# Patient Record
Sex: Male | Born: 1970 | Race: White | Hispanic: No | Marital: Married | Smoking: Never smoker
Health system: Southern US, Community
[De-identification: ages and names within clinical notes are randomized; demographics above are authoritative.]

## PROBLEM LIST (undated history)

## (undated) DIAGNOSIS — Z9889 Other specified postprocedural states: Secondary | ICD-10-CM

## (undated) DIAGNOSIS — E785 Hyperlipidemia, unspecified: Secondary | ICD-10-CM

## (undated) DIAGNOSIS — F329 Major depressive disorder, single episode, unspecified: Secondary | ICD-10-CM

## (undated) DIAGNOSIS — R05 Cough: Secondary | ICD-10-CM

## (undated) DIAGNOSIS — B001 Herpesviral vesicular dermatitis: Secondary | ICD-10-CM

## (undated) DIAGNOSIS — F429 Obsessive-compulsive disorder, unspecified: Secondary | ICD-10-CM

## (undated) DIAGNOSIS — R112 Nausea with vomiting, unspecified: Secondary | ICD-10-CM

## (undated) DIAGNOSIS — F419 Anxiety disorder, unspecified: Secondary | ICD-10-CM

## (undated) DIAGNOSIS — S83249A Other tear of medial meniscus, current injury, unspecified knee, initial encounter: Secondary | ICD-10-CM

## (undated) DIAGNOSIS — J209 Acute bronchitis, unspecified: Secondary | ICD-10-CM

## (undated) DIAGNOSIS — K219 Gastro-esophageal reflux disease without esophagitis: Secondary | ICD-10-CM

## (undated) DIAGNOSIS — I10 Essential (primary) hypertension: Secondary | ICD-10-CM

## (undated) DIAGNOSIS — F32A Depression, unspecified: Secondary | ICD-10-CM

## (undated) DIAGNOSIS — R21 Rash and other nonspecific skin eruption: Secondary | ICD-10-CM

## (undated) DIAGNOSIS — G4730 Sleep apnea, unspecified: Secondary | ICD-10-CM

## (undated) DIAGNOSIS — R0981 Nasal congestion: Secondary | ICD-10-CM

## (undated) HISTORY — PX: ANTERIOR CRUCIATE LIGAMENT REPAIR: SHX115

## (undated) HISTORY — PX: ADENOIDECTOMY W/ MYRINGOTOMY: SHX1128

## (undated) HISTORY — PX: KNEE ARTHROSCOPY: SUR90

## (undated) HISTORY — PX: MOUTH SURGERY: SHX715

## (undated) HISTORY — PX: COSMETIC SURGERY: SHX468

---

## 1999-05-25 ENCOUNTER — Ambulatory Visit (HOSPITAL_COMMUNITY): Admission: RE | Admit: 1999-05-25 | Discharge: 1999-05-25 | Payer: Self-pay | Admitting: Family Medicine

## 1999-08-10 ENCOUNTER — Ambulatory Visit: Admission: RE | Admit: 1999-08-10 | Discharge: 1999-08-10 | Payer: Self-pay | Admitting: Family Medicine

## 2003-02-11 ENCOUNTER — Ambulatory Visit (HOSPITAL_BASED_OUTPATIENT_CLINIC_OR_DEPARTMENT_OTHER): Admission: RE | Admit: 2003-02-11 | Discharge: 2003-02-11 | Payer: Self-pay | Admitting: Family Medicine

## 2003-06-09 ENCOUNTER — Ambulatory Visit (HOSPITAL_BASED_OUTPATIENT_CLINIC_OR_DEPARTMENT_OTHER): Admission: RE | Admit: 2003-06-09 | Discharge: 2003-06-09 | Payer: Self-pay | Admitting: Family Medicine

## 2003-12-24 ENCOUNTER — Inpatient Hospital Stay (HOSPITAL_COMMUNITY): Admission: EM | Admit: 2003-12-24 | Discharge: 2003-12-25 | Payer: Self-pay | Admitting: Emergency Medicine

## 2008-01-31 ENCOUNTER — Encounter (INDEPENDENT_AMBULATORY_CARE_PROVIDER_SITE_OTHER): Payer: Self-pay | Admitting: Family Medicine

## 2008-01-31 ENCOUNTER — Ambulatory Visit: Admission: RE | Admit: 2008-01-31 | Discharge: 2008-01-31 | Payer: Self-pay | Admitting: Family Medicine

## 2008-01-31 ENCOUNTER — Ambulatory Visit: Payer: Self-pay | Admitting: Vascular Surgery

## 2008-04-04 ENCOUNTER — Ambulatory Visit (HOSPITAL_COMMUNITY): Admission: RE | Admit: 2008-04-04 | Discharge: 2008-04-04 | Payer: Self-pay | Admitting: Orthopedic Surgery

## 2010-11-18 NOTE — Discharge Summary (Signed)
NAME:  AARONJAMES, KELSAY                        ACCOUNT NO.:  [id number]   MEDICAL RECORD NO.:  [id number]                   PATIENT TYPE:  INP   LOCATION:  0467                                 FACILITY:  [facility]   PHYSICIAN:  Sherin Quarry, MD                   DATE OF BIRTH:  10-29-1970   DATE OF ADMISSION:  12/24/2003  DATE OF DISCHARGE:  12/25/2003                                 DISCHARGE SUMMARY   Reva Bores. Fussell is a very pleasant gentleman, who indicates that on Monday  prior to admission, a friend of his suggested that they take up weight  lifting.  His friend apparently has done a lot of weight lifting over the  years and encouraged him to lift quite heavy weights over a period of 1  hour.  They did this in an enclosed space that was pretty hot.  He indicates  that he felt he was very exhausted as a result of this but did not want to  stop in light of his friend's encouragement.  That night he felt extremely  sore.  On Tuesday, he continued to have generalized muscle aching but once  again did extensive weight lifting with his friend.  On June 23, he  presented to Dr. Tiburcio Pea' office complaining of fatigue and generalized  myalgias as well as loss of appetite.  Dr. Tiburcio Pea did a series of blood  tests including a CPK which was apparently 6817.  A urinalysis was normal.  In light of these findings, he was sent to the emergency room where he was  seen by Dr. Julio Sicks.   PHYSICAL EXAMINATION:  VITAL SIGNS:  Dr. Julio Sicks reported that on his  physical exam, his blood pressure was 150/96.  His heart rate was 90,  respirations 18, temperature 98.2.  HEENT:  Within normal limits.  CHEST:  Clear.  CARDIOVASCULAR:  Normal S1 and S2 without rubs, murmurs, or gallops.  ABDOMEN:  Benign.  There were normal bowel sounds without masses or  tenderness.  There was no guarding or rebound.  NEUROLOGIC TESTING/EXAMINATION OF EXTREMITIES:  Normal.   Laboratory studies obtained included a  CBC which revealed a hemoglobin of  16.5.  White count was 4000.  Potassium was 3.9, creatinine 0.9.  Glucose  was 120.  The urinalysis was absolutely normal.   The patient was felt to have evidence of mild rhabdomyolysis, and it was  felt prudent to admit him to the hospital.  He was started on IV of normal  saline at 200 mL/hr.  During the course of his hospitalization, his blood  pressure was carefully monitored.  On the following evening, his blood  pressure was 160/101 and 157/81 on two determinations.  The next day, the  blood pressure was 141/77 and 140/80 on two determinations.  CPK level was  monitored.  On June 24, a.m., it was 6614.  On June 24, p.m., it  was 5900.  Renal function was repeated.  The creatinine was 0.8.  Phosphorous was 3.3.  The patient indicated that he felt better with IV hydrated.  On June 24, it  was felt reasonable to discharge him.   DISCHARGE DIAGNOSES:  1. Mild rhabdomyolysis, probably secondary to excessive weight lifting.  2. Concern about hypertension.  On discharge, the patient was advised to     refrain from weight lifting until further notice.  He was encouraged,     however, to engage in reasonable aerobic activity.  He was also advised     to avoid excessive exercise in a hot environment.  He was advised to     drink plenty of fluids and try to keep up a good urine output with clear     urine.  He was advised that he could return to work on Monday.  I     encouraged him to return to see Dr. Tiburcio Pea on Thursday.  The purpose of     this was two-fold.  First, just to follow up on his status     and make sure he was doing alright and secondly because of concern about     his blood pressure, I thought it would be a good idea to repeat this     value.   CONDITION AT THE TIME OF DISCHARGE:  Good.                                               Sherin Quarry, MD    SY/MEDQ  D:  12/25/2003  T:  12/26/2003  Job:  [id number]   cc:   Holley Bouche,  M.D.  [address]  [place], [place] [address]  Fax: [phone]

## 2011-01-11 ENCOUNTER — Emergency Department (HOSPITAL_COMMUNITY)
Admission: EM | Admit: 2011-01-11 | Discharge: 2011-01-11 | Disposition: A | Discharge status: Home / Self Care | Payer: BC Managed Care – PPO | Attending: Emergency Medicine | Admitting: Emergency Medicine

## 2011-01-11 ENCOUNTER — Emergency Department (HOSPITAL_COMMUNITY): Payer: BC Managed Care – PPO

## 2011-01-11 DIAGNOSIS — Z79899 Other long term (current) drug therapy: Secondary | ICD-10-CM | POA: Insufficient documentation

## 2011-01-11 DIAGNOSIS — Y998 Other external cause status: Secondary | ICD-10-CM | POA: Insufficient documentation

## 2011-01-11 DIAGNOSIS — Y92410 Unspecified street and highway as the place of occurrence of the external cause: Secondary | ICD-10-CM | POA: Insufficient documentation

## 2011-01-11 DIAGNOSIS — E119 Type 2 diabetes mellitus without complications: Secondary | ICD-10-CM | POA: Insufficient documentation

## 2011-01-11 DIAGNOSIS — E7800 Pure hypercholesterolemia, unspecified: Secondary | ICD-10-CM | POA: Insufficient documentation

## 2011-01-11 DIAGNOSIS — IMO0002 Reserved for concepts with insufficient information to code with codable children: Secondary | ICD-10-CM | POA: Insufficient documentation

## 2011-01-11 DIAGNOSIS — I10 Essential (primary) hypertension: Secondary | ICD-10-CM | POA: Insufficient documentation

## 2011-07-04 DIAGNOSIS — S83249A Other tear of medial meniscus, current injury, unspecified knee, initial encounter: Secondary | ICD-10-CM

## 2011-07-04 HISTORY — DX: Other tear of medial meniscus, current injury, unspecified knee, initial encounter: S83.249A

## 2011-07-24 DIAGNOSIS — J209 Acute bronchitis, unspecified: Secondary | ICD-10-CM

## 2011-07-24 HISTORY — DX: Acute bronchitis, unspecified: J20.9

## 2011-07-31 ENCOUNTER — Encounter (HOSPITAL_BASED_OUTPATIENT_CLINIC_OR_DEPARTMENT_OTHER): Payer: Self-pay | Admitting: *Deleted

## 2011-07-31 DIAGNOSIS — R0981 Nasal congestion: Secondary | ICD-10-CM

## 2011-07-31 DIAGNOSIS — R059 Cough, unspecified: Secondary | ICD-10-CM

## 2011-07-31 DIAGNOSIS — R21 Rash and other nonspecific skin eruption: Secondary | ICD-10-CM

## 2011-07-31 HISTORY — DX: Cough, unspecified: R05.9

## 2011-07-31 HISTORY — DX: Nasal congestion: R09.81

## 2011-07-31 HISTORY — DX: Rash and other nonspecific skin eruption: R21

## 2011-07-31 NOTE — Pre-Procedure Instructions (Signed)
To come for BMET and EKG 

## 2011-08-01 ENCOUNTER — Ambulatory Visit (HOSPITAL_BASED_OUTPATIENT_CLINIC_OR_DEPARTMENT_OTHER)
Admission: RE | Admit: 2011-08-01 | Discharge: 2011-08-01 | Disposition: A | Discharge status: Home / Self Care | Payer: BC Managed Care – PPO | Source: Ambulatory Visit | Attending: Orthopedic Surgery | Admitting: Orthopedic Surgery

## 2011-08-01 ENCOUNTER — Encounter (HOSPITAL_BASED_OUTPATIENT_CLINIC_OR_DEPARTMENT_OTHER)
Admission: RE | Admit: 2011-08-01 | Discharge: 2011-08-01 | Disposition: A | Discharge status: Home / Self Care | Payer: BC Managed Care – PPO | Source: Ambulatory Visit | Attending: Orthopedic Surgery | Admitting: Orthopedic Surgery

## 2011-08-01 DIAGNOSIS — Z538 Procedure and treatment not carried out for other reasons: Secondary | ICD-10-CM | POA: Insufficient documentation

## 2011-08-01 DIAGNOSIS — M23305 Other meniscus derangements, unspecified medial meniscus, unspecified knee: Secondary | ICD-10-CM | POA: Insufficient documentation

## 2011-08-01 LAB — BASIC METABOLIC PANEL
CO2: 25 mEq/L (ref 19–32)
Calcium: 9.5 mg/dL (ref 8.4–10.5)
Chloride: 102 mEq/L (ref 96–112)
Creatinine, Ser: 0.98 mg/dL (ref 0.50–1.35)
GFR calc non Af Amer: >90 mL/min (ref >90)
Potassium: 4.3 mEq/L (ref 3.5–5.1)

## 2011-08-02 NOTE — Progress Notes (Signed)
Mr Jain called and left a message - wanted to know if he could take his Clonopin --- called back and told him he could take a dose tonight if needed.

## 2011-08-02 NOTE — H&P (Signed)
[facility] [address]   [address] [place], [place] [address] [phone] A Division of [facility]  Loreta Ave, M.D.     Robert A. Thurston Hole, M.D.     Lunette Stands, M.D. Eulas Post, M.D.    Buford Dresser, M.D. Estell Harpin, M.D. Ralene Cork, D.O.          Genene Churn. Barry Dienes, PA-C            Kirstin A. Shepperson, PA-C Janace Litten, OPA-C   RE: Ricky Henry, Ricky Henry                                [id number]      DOB: Feb 22, 1971 INITIAL EVALUATION:  07-14-11 Ricky Henry comes in for evaluation of his right knee.  Persistent continued symptoms of increasing mechanical locking, catching and buckling of the right knee.  No new injury.  He has a previous history of knee injury with an arthroscopy back in 1990 and then an ACL reconstruction in 1991 by Dr. Dominica Severin in [place], [place].  Recovered, rehabbed and did reasonably well after that reconstruction until 2009.  Insidious onset of catching and eventually locking.  This was seen and evaluated by Dr. Turner Daniels back in 2009 with x-rays showing tunnels from previous reconstruction and transverse screws at the exit of the tunnels, both tibia and femur.  Some degenerative changes, not marked.  MRI at that time showed that his graft looked like it was intact, but he had tearing of the posterior aspect medial and lateral meniscus.  Some previous meniscal resection.  He was also noted to have some areas of significant chondrosis with near full thickness loss medially.  Arthroscopy recommended which he did not do.  He has given this time and alteration of activity, but it is getting worse rather than better.  He has put on weight and would really like to start a program of exercise and weight loss.  His knee is preventing this.  He comes in to discuss definitive treatment.  Really denies giving way.  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   History is  reviewed, updated and included in the chart.  I have also gone over his previous evaluation and treatment notes by Dr. Turner Daniels and reviewed his MRI report and scan itself.    EXAMINATION: General exam is outlined and included in the chart.  Height: 5?8.  Weight: 288 pounds.  Central obesity.  Normal gait and stance.  Symptomatic right knee tender medial and lateral joint line.  Positive McMurray's, both sides.  I can get him through full motion and his ACL does feel intact.  Not a lot of grating or crepitus.  Only a trace effusion.  Opposite left knee has full motion.  Good stability.  No meniscal signs.    X-RAYS: Standing four view x-ray does show decreased joint space medially, moderate, not extreme.  Reconstruction tunnels and screws in place.  No acute fractures.    IMPRESSION: Complex meniscal tears, right knee, after previous ACL reconstruction.   PLAN: I think his graft is intact.  Sufficient symptoms to warrant treatment.  Discussed exam under anesthesia, arthroscopy and debridement of his knee.  Outcome is going to be very dependent on the degree of degenerative changes that are found and he understands that.  I don't think it is worth repeating his MRI.  I have  discussed that with him and he agrees.  See him at the time of operative intervention   Loreta Ave, M.D.   Electronically verified by Loreta Ave, M.D. DFM:jjh D 07-14-11 T 07-17-11

## 2011-08-03 ENCOUNTER — Encounter (HOSPITAL_BASED_OUTPATIENT_CLINIC_OR_DEPARTMENT_OTHER): Payer: Self-pay | Admitting: Anesthesiology

## 2011-08-03 ENCOUNTER — Encounter (HOSPITAL_BASED_OUTPATIENT_CLINIC_OR_DEPARTMENT_OTHER): Payer: Self-pay | Admitting: *Deleted

## 2011-08-03 ENCOUNTER — Encounter (HOSPITAL_BASED_OUTPATIENT_CLINIC_OR_DEPARTMENT_OTHER)
Admission: RE | Disposition: A | Discharge status: Home / Self Care | Payer: Self-pay | Source: Ambulatory Visit | Attending: Orthopedic Surgery

## 2011-08-03 ENCOUNTER — Ambulatory Visit (HOSPITAL_BASED_OUTPATIENT_CLINIC_OR_DEPARTMENT_OTHER): Payer: BC Managed Care – PPO | Admitting: Anesthesiology

## 2011-08-03 ENCOUNTER — Ambulatory Visit (HOSPITAL_BASED_OUTPATIENT_CLINIC_OR_DEPARTMENT_OTHER)
Admission: RE | Admit: 2011-08-03 | Discharge: 2011-08-03 | Disposition: A | Discharge status: Home / Self Care | Payer: BC Managed Care – PPO | Source: Ambulatory Visit | Attending: Orthopedic Surgery | Admitting: Orthopedic Surgery

## 2011-08-03 DIAGNOSIS — Z01812 Encounter for preprocedural laboratory examination: Secondary | ICD-10-CM | POA: Insufficient documentation

## 2011-08-03 DIAGNOSIS — G4730 Sleep apnea, unspecified: Secondary | ICD-10-CM | POA: Insufficient documentation

## 2011-08-03 DIAGNOSIS — I10 Essential (primary) hypertension: Secondary | ICD-10-CM | POA: Insufficient documentation

## 2011-08-03 DIAGNOSIS — M23302 Other meniscus derangements, unspecified lateral meniscus, unspecified knee: Secondary | ICD-10-CM | POA: Insufficient documentation

## 2011-08-03 DIAGNOSIS — Z4789 Encounter for other orthopedic aftercare: Secondary | ICD-10-CM

## 2011-08-03 DIAGNOSIS — Z01810 Encounter for preprocedural cardiovascular examination: Secondary | ICD-10-CM | POA: Insufficient documentation

## 2011-08-03 DIAGNOSIS — M23305 Other meniscus derangements, unspecified medial meniscus, unspecified knee: Secondary | ICD-10-CM | POA: Insufficient documentation

## 2011-08-03 HISTORY — DX: Depression, unspecified: F32.A

## 2011-08-03 HISTORY — DX: Anxiety disorder, unspecified: F41.9

## 2011-08-03 HISTORY — DX: Other tear of medial meniscus, current injury, unspecified knee, initial encounter: S83.249A

## 2011-08-03 HISTORY — DX: Nausea with vomiting, unspecified: R11.2

## 2011-08-03 HISTORY — DX: Major depressive disorder, single episode, unspecified: F32.9

## 2011-08-03 HISTORY — DX: Essential (primary) hypertension: I10

## 2011-08-03 HISTORY — DX: Acute bronchitis, unspecified: J20.9

## 2011-08-03 HISTORY — DX: Rash and other nonspecific skin eruption: R21

## 2011-08-03 HISTORY — DX: Herpesviral vesicular dermatitis: B00.1

## 2011-08-03 HISTORY — DX: Nasal congestion: R09.81

## 2011-08-03 HISTORY — DX: Obsessive-compulsive disorder, unspecified: F42.9

## 2011-08-03 HISTORY — DX: Sleep apnea, unspecified: G47.30

## 2011-08-03 HISTORY — DX: Other specified postprocedural states: Z98.890

## 2011-08-03 HISTORY — DX: Cough: R05

## 2011-08-03 LAB — POCT HEMOGLOBIN-HEMACUE: Hemoglobin: 17.7 g/dL — ABNORMAL HIGH (ref 13.0–17.0)

## 2011-08-03 SURGERY — ARTHROSCOPY, KNEE, WITH MEDIAL MENISCECTOMY
Anesthesia: General | Site: Knee | Laterality: Right | Wound class: Clean

## 2011-08-03 MED ORDER — ONDANSETRON HCL 4 MG/2ML IJ SOLN
INTRAMUSCULAR | Status: DC | PRN
  Administered 2011-08-03: 4 mg via INTRAVENOUS

## 2011-08-03 MED ORDER — DEXAMETHASONE SODIUM PHOSPHATE 4 MG/ML IJ SOLN
INTRAMUSCULAR | Status: DC | PRN
  Administered 2011-08-03: 8 mg via INTRAVENOUS

## 2011-08-03 MED ORDER — PROMETHAZINE HCL 25 MG/ML IJ SOLN: 6.2500 mg | INTRAMUSCULAR | Status: DC | PRN

## 2011-08-03 MED ORDER — LIDOCAINE HCL (CARDIAC) 20 MG/ML IV SOLN
INTRAVENOUS | Status: DC | PRN
  Administered 2011-08-03: 60 mg via INTRAVENOUS

## 2011-08-03 MED ORDER — HYDROMORPHONE HCL PF 1 MG/ML IJ SOLN
0.2500 mg | INTRAMUSCULAR | Status: DC | PRN
  Administered 2011-08-03 (×2): 0.25 mg via INTRAVENOUS

## 2011-08-03 MED ORDER — CEFAZOLIN SODIUM 1-5 GM-% IV SOLN: 1.0000 g | INTRAVENOUS | Status: DC

## 2011-08-03 MED ORDER — FENTANYL CITRATE 0.05 MG/ML IJ SOLN
INTRAMUSCULAR | Status: DC | PRN
  Administered 2011-08-03: 100 ug via INTRAVENOUS

## 2011-08-03 MED ORDER — MIDAZOLAM HCL 5 MG/5ML IJ SOLN
INTRAMUSCULAR | Status: DC | PRN
  Administered 2011-08-03: 2 mg via INTRAVENOUS

## 2011-08-03 MED ORDER — VANCOMYCIN HCL IN DEXTROSE 1-5 GM/200ML-% IV SOLN
1000.0000 mg | Freq: Once | INTRAVENOUS | Status: AC
  Administered 2011-08-03: 1500 mg via INTRAVENOUS

## 2011-08-03 MED ORDER — SODIUM CHLORIDE 0.9 % IR SOLN
Status: DC | PRN
  Administered 2011-08-03: 3000 mL

## 2011-08-03 MED ORDER — PROPOFOL 10 MG/ML IV EMUL
INTRAVENOUS | Status: DC | PRN
  Administered 2011-08-03: 300 mg via INTRAVENOUS

## 2011-08-03 MED ORDER — LACTATED RINGERS IV SOLN
INTRAVENOUS | Status: DC
  Administered 2011-08-03: 09:00:00 via INTRAVENOUS

## 2011-08-03 SURGICAL SUPPLY — 38 items
BANDAGE ELASTIC 6 VELCRO ST LF (GAUZE/BANDAGES/DRESSINGS) ×2 IMPLANT
BLADE CUDA 5.5 (BLADE) IMPLANT
BLADE CUDA GRT WHITE 3.5 (BLADE) IMPLANT
BLADE CUTTER GATOR 3.5 (BLADE) ×2 IMPLANT
BLADE CUTTER MENIS 5.5 (BLADE) IMPLANT
BLADE GREAT WHITE 4.2 (BLADE) ×2 IMPLANT
BUR OVAL 4.0 (BURR) IMPLANT
CANISTER OMNI JUG 16 LITER (MISCELLANEOUS) ×2 IMPLANT
CANISTER SUCTION 2500CC (MISCELLANEOUS) IMPLANT
CLOTH BEACON ORANGE TIMEOUT ST (SAFETY) ×2 IMPLANT
CUTTER MENISCUS  4.2MM (BLADE)
CUTTER MENISCUS 4.2MM (BLADE) IMPLANT
DRAPE ARTHROSCOPY W/POUCH 90 (DRAPES) ×2 IMPLANT
DURAPREP 26ML APPLICATOR (WOUND CARE) ×2 IMPLANT
ELECT MENISCUS 165MM 90D (ELECTRODE) IMPLANT
ELECT REM PT RETURN 9FT ADLT (ELECTROSURGICAL)
ELECTRODE REM PT RTRN 9FT ADLT (ELECTROSURGICAL) IMPLANT
GAUZE XEROFORM 1X8 LF (GAUZE/BANDAGES/DRESSINGS) ×2 IMPLANT
GLOVE BIO SURGEON STRL SZ 6.5 (GLOVE) ×2 IMPLANT
GLOVE BIOGEL PI IND STRL 7.0 (GLOVE) ×1 IMPLANT
GLOVE BIOGEL PI IND STRL 8 (GLOVE) ×1 IMPLANT
GLOVE BIOGEL PI INDICATOR 7.0 (GLOVE) ×1
GLOVE BIOGEL PI INDICATOR 8 (GLOVE) ×1
GLOVE ORTHO TXT STRL SZ7.5 (GLOVE) ×4 IMPLANT
GOWN BRE IMP PREV XXLGXLNG (GOWN DISPOSABLE) ×2 IMPLANT
GOWN PREVENTION PLUS XLARGE (GOWN DISPOSABLE) ×4 IMPLANT
HOLDER KNEE FOAM BLUE (MISCELLANEOUS) ×2 IMPLANT
KNEE WRAP E Z 3 GEL PACK (MISCELLANEOUS) ×2 IMPLANT
PACK ARTHROSCOPY DSU (CUSTOM PROCEDURE TRAY) ×2 IMPLANT
PACK BASIN DAY SURGERY FS (CUSTOM PROCEDURE TRAY) ×2 IMPLANT
PENCIL BUTTON HOLSTER BLD 10FT (ELECTRODE) IMPLANT
SET ARTHROSCOPY TUBING (MISCELLANEOUS) ×2
SET ARTHROSCOPY TUBING LN (MISCELLANEOUS) ×1 IMPLANT
SPONGE GAUZE 4X4 12PLY (GAUZE/BANDAGES/DRESSINGS) ×4 IMPLANT
SUT ETHILON 3 0 PS 1 (SUTURE) ×2 IMPLANT
SUT VIC AB 3-0 FS2 27 (SUTURE) IMPLANT
TOWEL OR 17X24 6PK STRL BLUE (TOWEL DISPOSABLE) ×2 IMPLANT
WATER STERILE IRR 1000ML POUR (IV SOLUTION) ×2 IMPLANT

## 2011-08-03 NOTE — Anesthesia Preprocedure Evaluation (Signed)
Anesthesia Evaluation  Patient identified by MRN, date of birth, ID band Patient awake    Reviewed: Allergy & Precautions, H&P , NPO status , Patient's Chart, lab work & pertinent test results, reviewed documented beta blocker date and time   History of Anesthesia Complications (+) PONV  Airway Mallampati: II TM Distance: >3 FB Neck ROM: Full    Dental No notable dental hx. (+) Teeth Intact and Dental Advisory Given   Pulmonary sleep apnea and Continuous Positive Airway Pressure Ventilation , Recent URI , Residual Cough,  clear to auscultation  Pulmonary exam normal       Cardiovascular hypertension, Pt. on medications and Pt. on home beta blockers Regular Normal    Neuro/Psych    GI/Hepatic negative GI ROS, Neg liver ROS,   Endo/Other  Morbid obesity  Renal/GU negative Renal ROS     Musculoskeletal   Abdominal Normal abdominal exam  (+) obese,   Peds  Hematology negative hematology ROS (+)   Anesthesia Other Findings   Reproductive/Obstetrics                           Anesthesia Physical Anesthesia Plan  ASA: III  Anesthesia Plan: General   Post-op Pain Management:    Induction: Intravenous  Airway Management Planned: LMA  Additional Equipment:   Intra-op Plan:   Post-operative Plan:   Informed Consent: I have reviewed the patients History and Physical, chart, labs and discussed the procedure including the risks, benefits and alternatives for the proposed anesthesia with the patient or authorized representative who has indicated his/her understanding and acceptance.   Dental advisory given  Plan Discussed with: CRNA and Surgeon  Anesthesia Plan Comments: (Plan routine monitors, GA- LMA OK)        Anesthesia Quick Evaluation

## 2011-08-03 NOTE — Transfer of Care (Signed)
Immediate Anesthesia Transfer of Care Note  Patient: Ricky Henry  Procedure(s) Performed:  KNEE ARTHROSCOPY WITH MEDIAL MENISECTOMY - right knee arthroscopy partial medial and lateral menisectomy, chondroplasty lysis of adhesions.  Patient Location: PACU  Anesthesia Type: General  Level of Consciousness: awake, alert  and oriented  Airway & Oxygen Therapy: Patient Spontanous Breathing and Patient connected to face mask oxygen  Post-op Assessment: Report given to PACU RN and Post -op Vital signs reviewed and stable  Post vital signs: Reviewed and stable  Complications: No apparent anesthesia complications

## 2011-08-03 NOTE — Anesthesia Procedure Notes (Signed)
Procedure Name: LMA Insertion Performed by: Kapri Nero Pre-anesthesia Checklist: Patient identified, Timeout performed, Emergency Drugs available, Suction available and Patient being monitored Patient Re-evaluated:Patient Re-evaluated prior to inductionOxygen Delivery Method: Circle System Utilized Preoxygenation: Pre-oxygenation with 100% oxygen Intubation Type: IV induction Ventilation: Mask ventilation without difficulty LMA: LMA with gastric port inserted LMA Size: 4.0 Number of attempts: 1 Placement Confirmation: breath sounds checked- equal and bilateral and positive ETCO2 Tube secured with: Tape Dental Injury: Teeth and Oropharynx as per pre-operative assessment      

## 2011-08-03 NOTE — Brief Op Note (Signed)
08/03/2011  10:56 AM  PATIENT:  Ricky Henry  41 y.o. male  PRE-OPERATIVE DIAGNOSIS:  medial meniscus tear right knee  POST-OPERATIVE DIAGNOSIS:  same plus lateral meniscus tear, chondromalacia and adhesions.  PROCEDURE:  Procedure(s):  right KNEE ARTHROSCOPY WITH MEDIAL MENISECTOMY  SURGEON:  Surgeon(s): Loreta Ave, MD  PHYSICIAN ASSISTANT OWENS,JAMES M :   ANESTHESIA:   general  EBL:  Total I/O In: 1600 [I.V.:1600] Out: -    SPECIMEN:  No Specimen  TOURNIQUET:  * No tourniquets in log *   PATIENT DISPOSITION:  PACU - hemodynamically stable.

## 2011-08-03 NOTE — Interval H&P Note (Signed)
History and Physical Interval Note:  08/03/2011 7:23 AM  Ricky Henry  has presented today for surgery, with the diagnosis of mmt right knee  The various methods of treatment have been discussed with the patient and family. After consideration of risks, benefits and other options for treatment, the patient has consented to  Procedure(s): KNEE ARTHROSCOPY WITH MEDIAL MENISECTOMY as a surgical intervention .  The patients' history has been reviewed, patient examined, no change in status, stable for surgery.  I have reviewed the patients' chart and labs.  Questions were answered to the patient's satisfaction.     [name] F

## 2011-08-03 NOTE — Anesthesia Postprocedure Evaluation (Signed)
  Anesthesia Post-op Note  Patient: Ricky Henry  Procedure(s) Performed:  KNEE ARTHROSCOPY WITH MEDIAL MENISECTOMY - right knee arthroscopy partial medial and lateral menisectomy, chondroplasty lysis of adhesions.  Patient Location: PACU  Anesthesia Type: General  Level of Consciousness: awake, alert  and oriented  Airway and Oxygen Therapy: Patient Spontanous Breathing  Post-op Pain: none  Post-op Assessment: Post-op Vital signs reviewed, Patient's Cardiovascular Status Stable, Respiratory Function Stable, Patent Airway, No signs of Nausea or vomiting and Pain level controlled  Post-op Vital Signs: Reviewed and stable  Complications: No apparent anesthesia complications

## 2011-08-04 NOTE — Op Note (Signed)
NAME:  Ricky Henry, Ricky Henry                   ACCOUNT NO.:  MEDICAL RECORD NO.:  [id number]  LOCATION:                                 FACILITY:  PHYSICIAN:  Loreta Ave, M.D.      DATE OF BIRTH:  DATE OF PROCEDURE:  08/03/2011 DATE OF DISCHARGE:                              OPERATIVE REPORT   PREOPERATIVE DIAGNOSIS:  Right knee status post remote anterior cruciate ligament reconstruction.  Now with medial and lateral meniscus tears.  POSTOPERATIVE DIAGNOSES:  Right knee status post remote anterior cruciate ligament reconstruction. Now with medial and lateral meniscus tears with extensive tearing  of lateral meniscus, to lesser extent medial meniscus.  Significant anterior adhesions.  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 Somewhat attenuated, but intact functional graft.  PROCEDURES:  Right knee exam under anesthesia, arthroscopy.  Partial lateral and medial meniscectomies.  Lysis and debridement of adhesions. Chondroplasty of medial and lateral compartments.  SURGEON:  Loreta Ave, MD  ASSISTANT:  Genene Churn. Barry Dienes, [place]  ANESTHESIA:  General.  BLOOD LOSS:  Minimal.  SPECIMENS:  None.  CULTURES:  None.  COMPLICATION:  None.  DRESSINGS:  Soft compressive.  TOURNIQUET:  None employed.  PROCEDURE:  The patient was brought to the operating room and placed on the operating table in supine position.  After adequate anesthesia had been obtained, knee examined.  Some tethering of patellofemoral joint. Full motion.  Little increased excursion on Lachman and drawer, but good endpoint.  Leg holder applied, leg prepped and draped in usual sterile fashion.  Two portals, one each medial and lateral parapatellar. Arthroscope was introduced, knee distended and inspected.  Prominent adhesions in front of the notch and throughout the anterior aspect of the knee, all debrided.  Good patellofemoral tracking with minimal change in  patellofemoral joint.  Generous previous medial meniscectomy with tearing of all what was left of the anterior and middle thirds saucerized out and tapered smoothly.  Grade 2 and 3 changes, most marked on the condyle, debrided.  Not grade 4.  Adhesions from the front of the notch cleared out.  Behind that, there was still an intact functional graft. A little attenuated.  Laterally double bucket handle tear with marked displaced fragments.  The front bucket handle torn in half, so it was flipped up into the notch.  Other half out into the front of the compartment.  Nothing reparable at all.  By the time I debrided out all unstable tissue, this was pretty much completed lateral meniscectomy. Some grade 2 and 3 changes of the compartment debrided as well.  Entire knee examined, no other findings appreciated.  Instruments and fluid removed.  Knee injected with Depo-Medrol and Marcaine.  Portals closed with nylon.  Sterile compressive dressing applied.  Anesthesia reversed. Brought to the recovery room.  Tolerated the surgery well.  No complications.     Loreta Ave, M.D.     DFM/MEDQ  D:  08/03/2011  T:  08/04/2011  Job:  [id number]

## 2011-08-10 ENCOUNTER — Encounter (INDEPENDENT_AMBULATORY_CARE_PROVIDER_SITE_OTHER): Payer: BC Managed Care – PPO | Admitting: *Deleted

## 2011-08-10 ENCOUNTER — Other Ambulatory Visit: Payer: Self-pay | Admitting: *Deleted

## 2011-08-10 DIAGNOSIS — M79609 Pain in unspecified limb: Secondary | ICD-10-CM

## 2011-08-10 DIAGNOSIS — M7989 Other specified soft tissue disorders: Secondary | ICD-10-CM

## 2011-08-19 IMAGING — CT CT HEAD W/O CM
5 of 7 series · 16 of 37 positions shown, 18 images · non-contrast
Comparison: None.

CT HEAD

CLINICAL DATA: Trauma.  Pain.  Abrasions right-sided head.  The
patient denies loss of consciousness.  Tightness in the posterior
neck.

CT HEAD WITHOUT CONTRAST
CT CERVICAL SPINE WITHOUT CONTRAST
TECHNIQUE: Multidetector CT imaging of the head and cervical spine
was performed following the standard protocol without intravenous
contrast.  Multiplanar CT image reconstructions of the cervical
spine were also generated.

[Series 3: recon 2: brain · axial · 0.47mm/px · z∈[-77,-31]mm · 2 of 56 slices shown]
[im 19/56  brain]
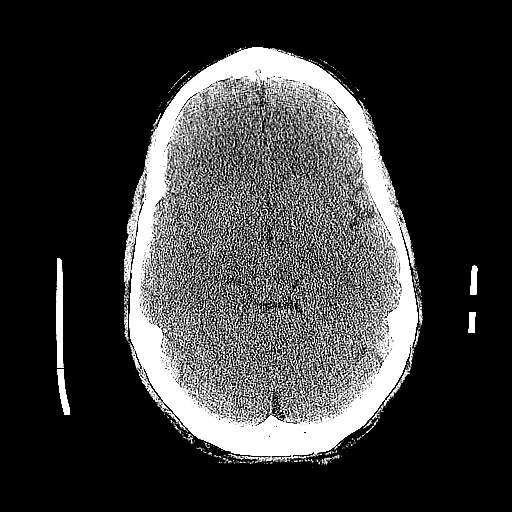
[im 37/56  brain]
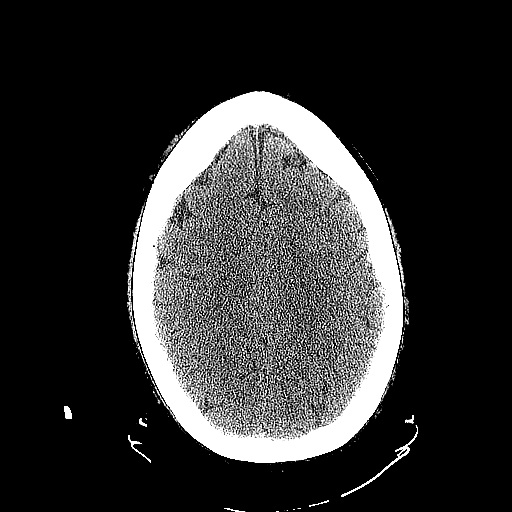

[Series 4: cervical spine · axial · 0.23mm/px · z∈[-309,-197]mm · 4 of 77 slices shown]
[im 16/77  brain]
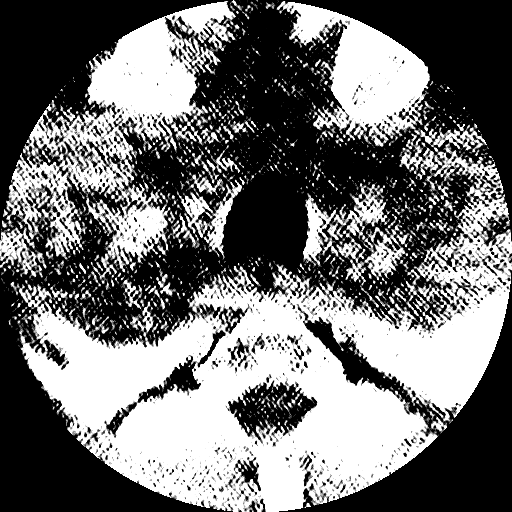
[im 31/77  brain]
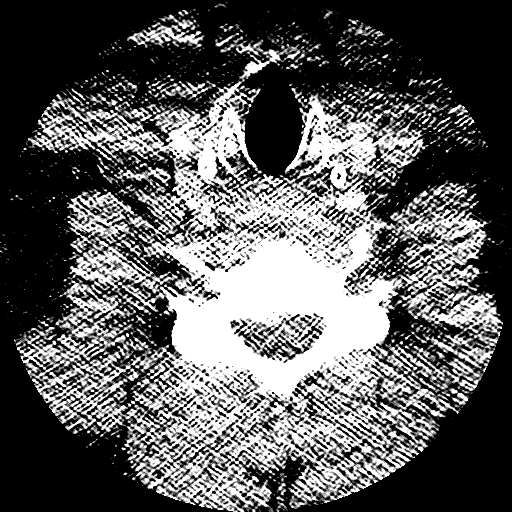
[im 46/77  brain]
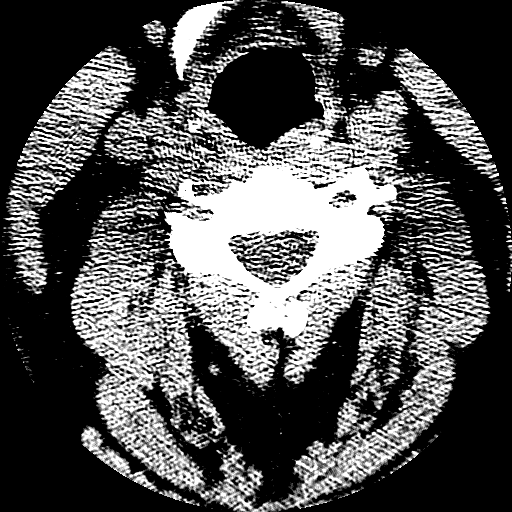
[im 61/77  brain]
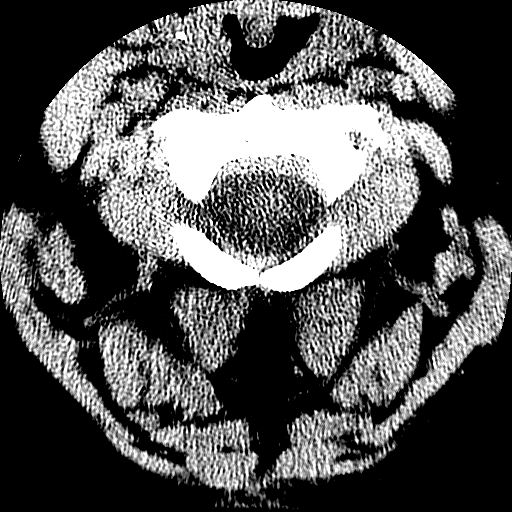

[Series 600: sagittals · sagittal · 0.43mm/px · 2 of 47 slices shown]
[im 16/47  brain]
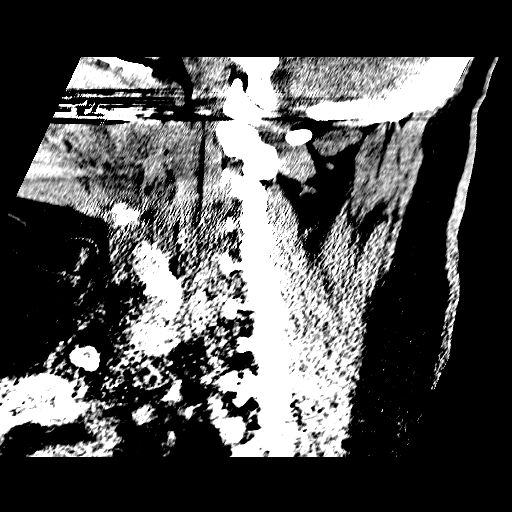
[im 31/47  brain]
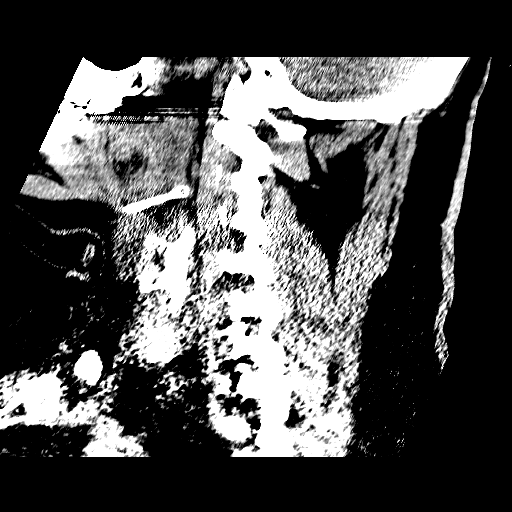

[Series 601: coronals · coronal · 0.43mm/px · 3 of 47 slices shown]
[im 9/47  brain]
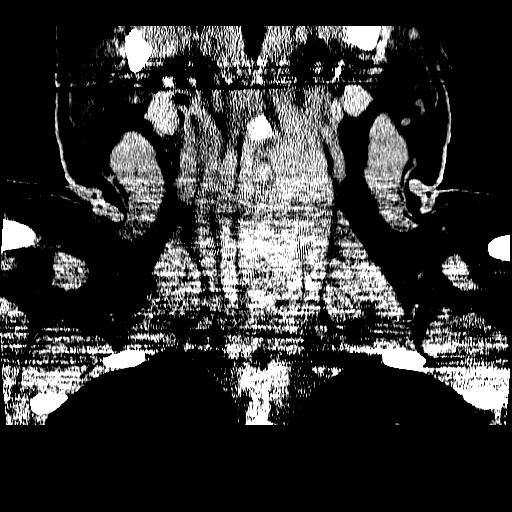
[im 12/47  brain]
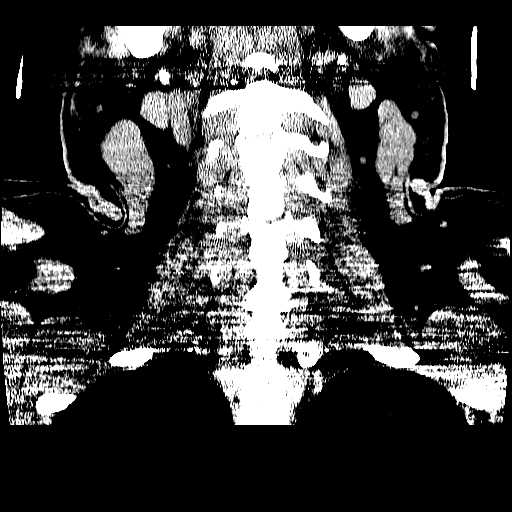
[im 16/47  brain]
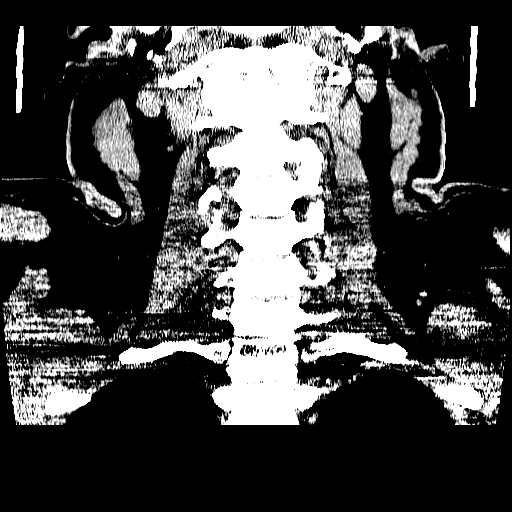

[Series 602: axials · axial · 0.27mm/px · z∈[-307,-202]mm · 5 of 92 slices shown, 7 images]
[im 16/92  brain]
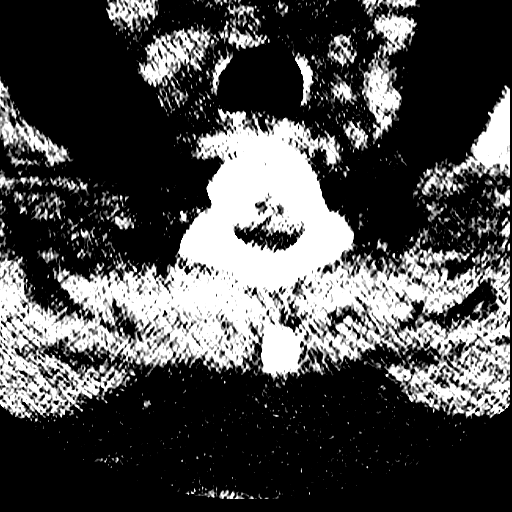
[im 16/92  bone]
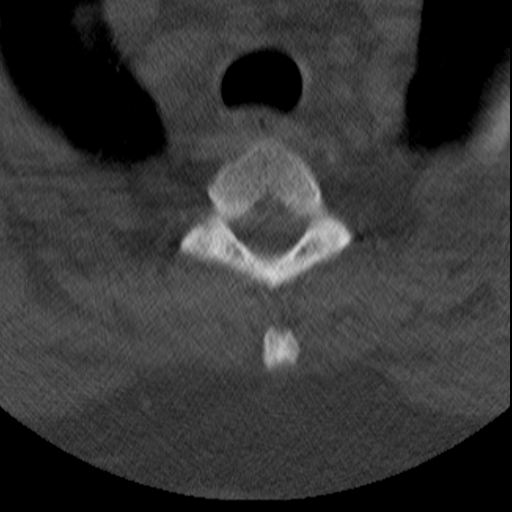
[im 31/92  brain]
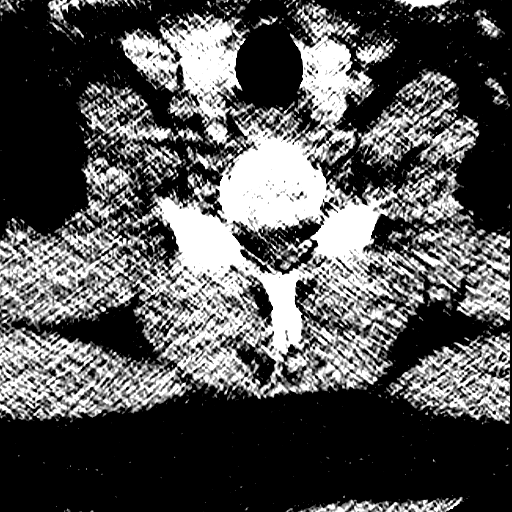
[im 46/92  brain]
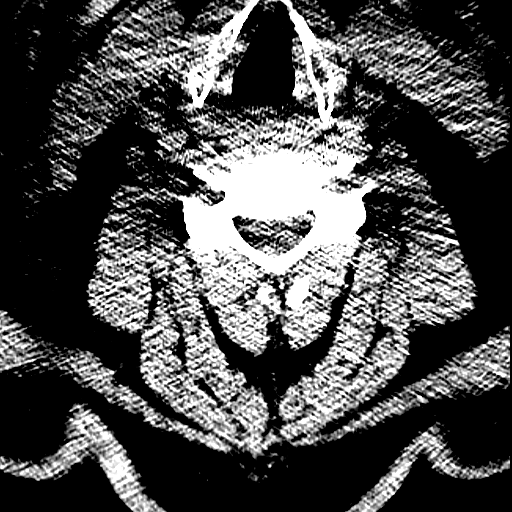
[im 61/92  brain]
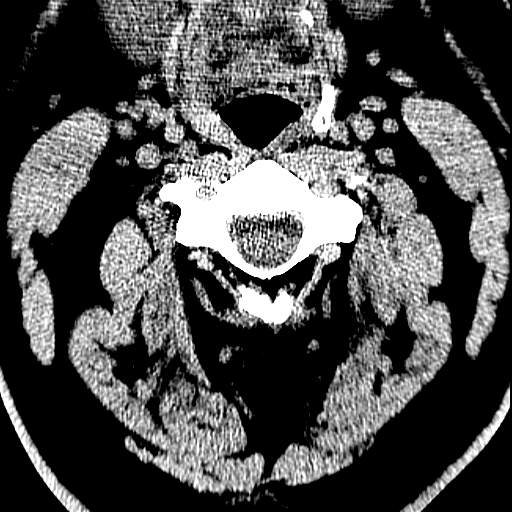
[im 76/92  brain]
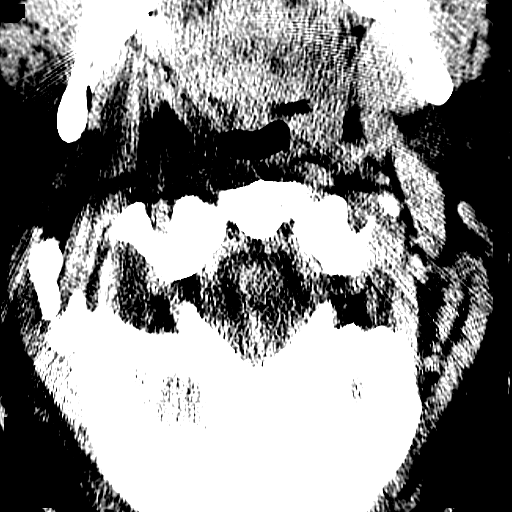
[im 76/92  bone]
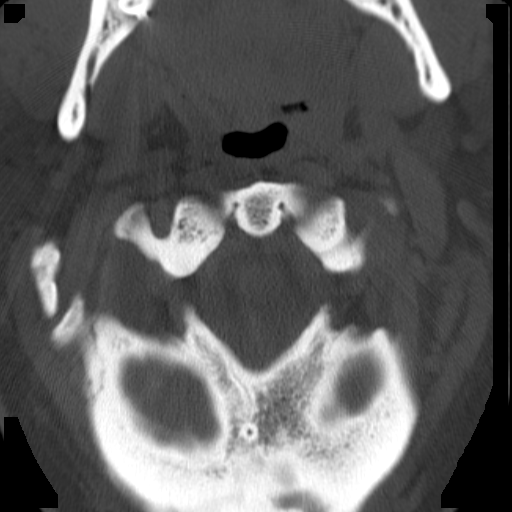

[16 of 37 positions shown; findings below may reference images not displayed]

FINDINGS: No acute cortical infarct, hemorrhage, mass lesion is
present. The ventricles are of normal size.  No significant extra-
axial fluid collection is present.  The paranasal sinuses and
mastoid air cells are clear.  The osseous skull is intact.  No
significant extracranial soft tissue injury is evident.
IMPRESSION: Negative CT of the head.

CT CERVICAL SPINE
FINDINGS: The cervical spine is imaged from the skull base through
T2.  Bone detail is limited in the lower cervical upper thoracic
spine secondary to body habitus.  No new definite fracture is
present.  Degenerative endplate changes are present at C6-7.  No
definite stenosis is present.  Left-sided osseous foraminal
narrowing at C7-T1 is secondary to asymmetric facet disease.

The soft tissues are unremarkable.  The lung apices are clear.
IMPRESSION: 1.  No acute fracture or traumatic subluxation.
2.  Mild spondylosis in the lower cervical spine.

## 2021-05-06 ENCOUNTER — Encounter: Payer: Self-pay | Admitting: *Deleted

## 2021-05-09 ENCOUNTER — Ambulatory Visit: Payer: BC Managed Care – PPO | Admitting: Anesthesiology

## 2021-05-09 ENCOUNTER — Encounter: Payer: Self-pay | Admitting: *Deleted

## 2021-05-09 ENCOUNTER — Other Ambulatory Visit: Payer: Self-pay

## 2021-05-09 ENCOUNTER — Ambulatory Visit
Admission: RE | Admit: 2021-05-09 | Discharge: 2021-05-09 | Disposition: A | Discharge status: Home / Self Care | Payer: BC Managed Care – PPO | Attending: Gastroenterology | Admitting: Gastroenterology

## 2021-05-09 ENCOUNTER — Encounter
Admission: RE | Disposition: A | Discharge status: Home / Self Care | Payer: Self-pay | Source: Home / Self Care | Attending: Gastroenterology

## 2021-05-09 DIAGNOSIS — F419 Anxiety disorder, unspecified: Secondary | ICD-10-CM | POA: Diagnosis not present

## 2021-05-09 DIAGNOSIS — K640 First degree hemorrhoids: Secondary | ICD-10-CM | POA: Insufficient documentation

## 2021-05-09 DIAGNOSIS — K648 Other hemorrhoids: Secondary | ICD-10-CM | POA: Diagnosis not present

## 2021-05-09 DIAGNOSIS — I10 Essential (primary) hypertension: Secondary | ICD-10-CM | POA: Diagnosis not present

## 2021-05-09 DIAGNOSIS — Z1211 Encounter for screening for malignant neoplasm of colon: Secondary | ICD-10-CM | POA: Insufficient documentation

## 2021-05-09 DIAGNOSIS — Z79899 Other long term (current) drug therapy: Secondary | ICD-10-CM | POA: Insufficient documentation

## 2021-05-09 DIAGNOSIS — G4730 Sleep apnea, unspecified: Secondary | ICD-10-CM | POA: Diagnosis not present

## 2021-05-09 DIAGNOSIS — Z6841 Body Mass Index (BMI) 40.0 and over, adult: Secondary | ICD-10-CM | POA: Diagnosis not present

## 2021-05-09 DIAGNOSIS — F429 Obsessive-compulsive disorder, unspecified: Secondary | ICD-10-CM | POA: Diagnosis not present

## 2021-05-09 DIAGNOSIS — F32A Depression, unspecified: Secondary | ICD-10-CM | POA: Insufficient documentation

## 2021-05-09 DIAGNOSIS — D122 Benign neoplasm of ascending colon: Secondary | ICD-10-CM | POA: Insufficient documentation

## 2021-05-09 DIAGNOSIS — E785 Hyperlipidemia, unspecified: Secondary | ICD-10-CM | POA: Insufficient documentation

## 2021-05-09 DIAGNOSIS — K219 Gastro-esophageal reflux disease without esophagitis: Secondary | ICD-10-CM | POA: Insufficient documentation

## 2021-05-09 HISTORY — DX: Gastro-esophageal reflux disease without esophagitis: K21.9

## 2021-05-09 HISTORY — DX: Hyperlipidemia, unspecified: E78.5

## 2021-05-09 HISTORY — PX: COLONOSCOPY WITH PROPOFOL: SHX5780

## 2021-05-09 SURGERY — COLONOSCOPY WITH PROPOFOL
Anesthesia: General

## 2021-05-09 MED ORDER — PROPOFOL 10 MG/ML IV BOLUS
INTRAVENOUS | Status: AC
  Filled 2021-05-09: qty 20

## 2021-05-09 MED ORDER — PROPOFOL 500 MG/50ML IV EMUL
INTRAVENOUS | Status: AC
  Filled 2021-05-09: qty 50

## 2021-05-09 MED ORDER — SODIUM CHLORIDE 0.9 % IV SOLN: INTRAVENOUS | Status: DC

## 2021-05-09 MED ORDER — PROPOFOL 500 MG/50ML IV EMUL
INTRAVENOUS | Status: DC | PRN
  Administered 2021-05-09: 150 ug/kg/min via INTRAVENOUS

## 2021-05-09 NOTE — Op Note (Signed)
[facility] Gastroenterology Patient Name: Ricky Henry Procedure Date: 05/09/2021 8:25 AM MRN: [id number] Account #: [id number] Date of Birth: 06-Jan-1971 Admit Type: Outpatient Age: 50 Room: [facility] ENDO ROOM 3 Gender: Male Note Status: Finalized Instrument Name: Park Meo [id number] Procedure:             Colonoscopy Indications:           Screening for colorectal malignant neoplasm Providers:             Andrey Farmer MD, MD Referring MD:          Renee Rival (Referring MD) Medicines:             Monitored Anesthesia Care Complications:         No immediate complications. Estimated blood loss:                         Minimal. Procedure:             Pre-Anesthesia Assessment:                        - Prior to the procedure, a History and Physical was                         performed, and patient medications and allergies were                         reviewed. The patient is competent. The risks and                         benefits of the procedure and the sedation options and                         risks were discussed with the patient. All questions                         were answered and informed consent was obtained.                         Patient identification and proposed procedure were                         verified by the physician, the nurse, the anesthetist                         and the technician in the endoscopy suite. Mental                         Status Examination: alert and oriented. Airway                         Examination: normal oropharyngeal airway and neck                         mobility. Respiratory Examination: clear to                         auscultation. CV Examination: normal. Prophylactic  Antibiotics: The patient does not require prophylactic                         antibiotics. Prior Anticoagulants: The patient has                         taken no previous anticoagulant or antiplatelet                          agents. ASA Grade Assessment: III - A patient with                         severe systemic disease. After reviewing the risks and                         benefits, the patient was deemed in satisfactory                         condition to undergo the procedure. The anesthesia                         plan was to use monitored anesthesia care (MAC).                         Immediately prior to administration of medications,                         the patient was re-assessed for adequacy to receive                         sedatives. The heart rate, respiratory rate, oxygen                         saturations, blood pressure, adequacy of pulmonary                         ventilation, and response to care were monitored                         throughout the procedure. The physical status of the                         patient was re-assessed after the procedure.                        After obtaining informed consent, the colonoscope was                         passed under direct vision. Throughout the procedure,                         the patient's blood pressure, pulse, and oxygen                         saturations were monitored continuously. The                         Colonoscope was introduced through the anus and  advanced to the the cecum, identified by appendiceal                         orifice and ileocecal valve. The colonoscopy was                         performed without difficulty. The patient tolerated                         the procedure well. The quality of the bowel                         preparation was good. Findings:      The perianal and digital rectal examinations were normal.      A 5 mm polyp was found in the ascending colon. The polyp was sessile.       The polyp was removed with a cold snare. Resection and retrieval were       complete. Estimated blood loss was minimal.      Internal hemorrhoids were found during  retroflexion. The hemorrhoids       were Grade I (internal hemorrhoids that do not prolapse).      The exam was otherwise without abnormality on direct and retroflexion       views. Impression:            - One 5 mm polyp in the ascending colon, removed with                         a cold snare. Resected and retrieved.                        - Internal hemorrhoids.                        - The examination was otherwise normal on direct and                         retroflexion views. Recommendation:        - Discharge patient to home.                        - Resume previous diet.                        - Continue present medications.                        - Await pathology results.                        - Repeat colonoscopy for surveillance based on                         pathology results.                        - Return to referring physician as previously                         scheduled. Procedure Code(s):     --- Professional ---  45385, Colonoscopy, flexible; with removal of                         tumor(s), polyp(s), or other lesion(s) by snare                         technique Diagnosis Code(s):     --- Professional ---                        Z12.11, Encounter for screening for malignant neoplasm                         of colon                        K63.5, Polyp of colon                        K64.0, First degree hemorrhoids CPT copyright 2019 [employer]. All rights reserved. The codes documented in this report are preliminary and upon coder review may  be revised to meet current compliance requirements. Andrey Farmer MD, MD 05/09/2021 8:51:05 AM Number of Addenda: 0 Note Initiated On: 05/09/2021 8:25 AM Scope Withdrawal Time: 0 hours 11 minutes 56 seconds  Total Procedure Duration: 0 hours 15 minutes 43 seconds  Estimated Blood Loss:  Estimated blood loss was minimal.      [facility]

## 2021-05-09 NOTE — H&P (Signed)
Outpatient short stay form Pre-procedure 05/09/2021  Lesly Rubenstein, MD  Primary Physician: Renee Rival, NP  Reason for visit:  Screening colonoscopy  History of present illness:   50 y/o gentleman with history of morbid obesity here for index screening colonoscopy. No blood thinners. No abdominal surgeries. No family history of GI malignancies. No new symptoms.    Current Facility-Administered Medications:    0.9 %  sodium chloride infusion, , Intravenous, Continuous, Oralee Rapaport, Hilton Cork, MD, Last Rate: 20 mL/hr at 05/09/21 0816, New Bag at 05/09/21 0816  Medications Prior to Admission  Medication Sig Dispense Refill Last Dose   atorvastatin (LIPITOR) 20 MG tablet Take 20 mg by mouth daily.   05/08/2021   buPROPion (WELLBUTRIN SR) 200 MG 12 hr tablet Take 150 mg by mouth 2 (two) times daily.   05/08/2021   busPIRone (BUSPAR) 15 MG tablet Take 15 mg by mouth 2 (two) times daily.   05/08/2021   DULoxetine (CYMBALTA) 60 MG capsule Take 60 mg by mouth daily.   05/08/2021   lisinopril (ZESTRIL) 10 MG tablet Take 10 mg by mouth daily.   05/09/2021 at 0550   metoprolol succinate (TOPROL-XL) 100 MG 24 hr tablet Take 150 mg by mouth daily. Take with or immediately following a meal.   05/09/2021 at 0550   omeprazole (PRILOSEC) 40 MG capsule Take 40 mg by mouth daily.   05/08/2021   Semaglutide (OZEMPIC, 0.25 OR 0.5 MG/DOSE, [place]) Inject into the skin.   Past Month   valACYclovir (VALTREX) 1000 MG tablet Take 1,000 mg by mouth as needed.   Past Month   clonazePAM (KLONOPIN) 0.5 MG tablet Take 0.5 mg by mouth 2 (two) times daily as needed.      furosemide (LASIX) 20 MG tablet Take 20 mg by mouth daily. (Patient not taking: Reported on 05/09/2021)   Not Taking   ibuprofen (ADVIL,MOTRIN) 200 MG tablet Take 200 mg by mouth every 6 (six) hours as needed.      venlafaxine (EFFEXOR-XR) 150 MG 24 hr capsule Take 150 mg by mouth daily. AM; 75 MG PM (Patient not taking: Reported on 05/09/2021)   Not Taking      Allergies  Allergen Reactions   Codeine Other (See Comments)    SUICIDAL THOUGHTS   Ceclor [Cefaclor] Hives   Erythromycin Other (See Comments)    MIGRAINES     Past Medical History:  Diagnosis Date   Anxiety    Bronchitis, acute 07/24/2011   finished antibiotic 07/28/2011   Cough 07/31/2011   clear mucus   Depression    GERD (gastroesophageal reflux disease)    Hyperlipidemia    Hypertension    has been on med. x "years"; states diastolic  runs near 90   MMT (medial meniscus tear) 07/04/2011   right knee   Nasal congestion 07/31/2011   OCD (obsessive compulsive disorder)    PONV (postoperative nausea and vomiting)    also anesthesia awareness with knee surg.; woke up during oral surg.   Rash 07/31/2011   legs   Recurrent cold sores    Sleep apnea 10 yrs.   uses CPAP nightly    Review of systems:  Otherwise negative.    Physical Exam  Gen: Alert, oriented. Appears stated age.  HEENT: PERRLA. Lungs: No respiratory distress CV: RRR Abd: soft, benign, no masses Ext: No edema    Planned procedures: Proceed with colonoscopy. The patient understands the nature of the planned procedure, indications, risks, alternatives and potential complications including  but not limited to bleeding, infection, perforation, damage to internal organs and possible oversedation/side effects from anesthesia. The patient agrees and gives consent to proceed.  Please refer to procedure notes for findings, recommendations and patient disposition/instructions.     Lesly Rubenstein, MD [facility]

## 2021-05-09 NOTE — Interval H&P Note (Signed)
History and Physical Interval Note:  05/09/2021 8:22 AM  Ricky Henry  has presented today for surgery, with the diagnosis of Z12.11 Colon Cancer Screening.  The various methods of treatment have been discussed with the patient and family. After consideration of risks, benefits and other options for treatment, the patient has consented to  Procedure(s): COLONOSCOPY WITH PROPOFOL (N/A) as a surgical intervention.  The patient's history has been reviewed, patient examined, no change in status, stable for surgery.  I have reviewed the patient's chart and labs.  Questions were answered to the patient's satisfaction.     Lesly Rubenstein  Ok to proceed with colonoscopy

## 2021-05-09 NOTE — Anesthesia Postprocedure Evaluation (Signed)
Anesthesia Post Note  Patient: Ricky Henry  Procedure(s) Performed: COLONOSCOPY WITH PROPOFOL  Patient location during evaluation: PACU Anesthesia Type: General Level of consciousness: awake and oriented Pain management: pain level controlled Vital Signs Assessment: post-procedure vital signs reviewed and stable Respiratory status: spontaneous breathing and nonlabored ventilation Cardiovascular status: blood pressure returned to baseline Anesthetic complications: no   No notable events documented.   Last Vitals:  Vitals:   05/09/21 0915 05/09/21 0925  BP: (!) 143/76 140/72  Pulse: 69 63  Resp: 16 13  Temp:    SpO2: 100% 100%    Last Pain:  Vitals:   05/09/21 0925  TempSrc:   PainSc: 0-No pain                 VAN STAVEREN,Kurtis Anastasia

## 2021-05-09 NOTE — Anesthesia Procedure Notes (Signed)
Date/Time: 05/09/2021 8:35 AM Performed by: Vaughan Sine Pre-anesthesia Checklist: Patient identified, Emergency Drugs available, Suction available, Patient being monitored and Timeout performed Patient Re-evaluated:Patient Re-evaluated prior to induction Oxygen Delivery Method: Supernova nasal CPAP Preoxygenation: Pre-oxygenation with 100% oxygen Induction Type: IV induction Placement Confirmation: CO2 detector and positive ETCO2

## 2021-05-09 NOTE — Anesthesia Preprocedure Evaluation (Signed)
Anesthesia Evaluation  Patient identified by MRN, date of birth, ID band Patient awake    Reviewed: Allergy & Precautions, NPO status , Patient's Chart, lab work & pertinent test results  Airway Mallampati: II  TM Distance: >3 FB Neck ROM: full    Dental no notable dental hx. (+) Teeth Intact   Pulmonary neg pulmonary ROS, sleep apnea and Continuous Positive Airway Pressure Ventilation ,    Pulmonary exam normal  + decreased breath sounds      Cardiovascular Exercise Tolerance: Good METS: hypertension, Pt. on medications negative cardio ROS Normal cardiovascular exam Rhythm:Regular Rate:Normal     Neuro/Psych Anxiety Depression negative neurological ROS  negative psych ROS   GI/Hepatic negative GI ROS, Neg liver ROS, GERD  ,  Endo/Other  negative endocrine ROSMorbid obesity  Renal/GU negative Renal ROS  negative genitourinary   Musculoskeletal negative musculoskeletal ROS (+)   Abdominal (+) + obese,   Peds negative pediatric ROS (+)  Hematology negative hematology ROS (+)   Anesthesia Other Findings Past Medical History: No date: Anxiety 07/24/2011: Bronchitis, acute     Comment:  finished antibiotic 07/28/2011 07/31/2011: Cough     Comment:  clear mucus No date: Depression No date: GERD (gastroesophageal reflux disease) No date: Hyperlipidemia No date: Hypertension     Comment:  has been on med. x "years"; states diastolic  runs near               90 07/04/2011: MMT (medial meniscus tear)     Comment:  right knee 07/31/2011: Nasal congestion No date: OCD (obsessive compulsive disorder) No date: PONV (postoperative nausea and vomiting)     Comment:  also anesthesia awareness with knee surg.; woke up               during oral surg. 07/31/2011: Rash     Comment:  legs No date: Recurrent cold sores 10 yrs.: Sleep apnea     Comment:  uses CPAP nightly  Past Surgical History: age 89: ADENOIDECTOMY W/  MYRINGOTOMY age 57: ANTERIOR CRUCIATE LIGAMENT REPAIR     Comment:  right age 46: COSMETIC SURGERY     Comment:  ear age 10: KNEE ARTHROSCOPY     Comment:  right age 78-12: MOUTH SURGERY     Reproductive/Obstetrics negative OB ROS                             Anesthesia Physical Anesthesia Plan  ASA: 3  Anesthesia Plan: General   Post-op Pain Management:    Induction:   PONV Risk Score and Plan: 1 and Ondansetron  Airway Management Planned: Nasal Cannula  Additional Equipment:   Intra-op Plan:   Post-operative Plan:   Informed Consent: I have reviewed the patients History and Physical, chart, labs and discussed the procedure including the risks, benefits and alternatives for the proposed anesthesia with the patient or authorized representative who has indicated his/her understanding and acceptance.     Dental Advisory Given  Plan Discussed with: CRNA and Surgeon  Anesthesia Plan Comments:         Anesthesia Quick Evaluation

## 2021-05-09 NOTE — Transfer of Care (Signed)
Immediate Anesthesia Transfer of Care Note  Patient: Ricky Henry  Procedure(s) Performed: COLONOSCOPY WITH PROPOFOL  Patient Location: PACU  Anesthesia Type:General  Level of Consciousness: awake and sedated  Airway & Oxygen Therapy: Patient Spontanous Breathing and Patient connected to nasal cannula oxygen  Post-op Assessment: Report given to RN and Post -op Vital signs reviewed and stable  Post vital signs: Reviewed and stable  Last Vitals:  Vitals Value Taken Time  BP    Temp    Pulse    Resp    SpO2      Last Pain:  Vitals:   05/09/21 0802  TempSrc: Temporal  PainSc: 0-No pain         Complications: No notable events documented.

## 2021-05-10 ENCOUNTER — Encounter: Payer: Self-pay | Admitting: Gastroenterology

## 2021-05-10 LAB — SURGICAL PATHOLOGY

## 2023-01-11 ENCOUNTER — Encounter: Payer: Self-pay | Admitting: Dietician

## 2023-01-11 ENCOUNTER — Encounter: Payer: BC Managed Care – PPO | Attending: Nurse Practitioner | Admitting: Dietician

## 2023-01-11 VITALS — Ht 68.0 in | Wt 331.1 lb

## 2023-01-11 DIAGNOSIS — E119 Type 2 diabetes mellitus without complications: Secondary | ICD-10-CM

## 2023-01-11 DIAGNOSIS — E669 Obesity, unspecified: Secondary | ICD-10-CM | POA: Diagnosis not present

## 2023-01-11 DIAGNOSIS — Z6841 Body Mass Index (BMI) 40.0 and over, adult: Secondary | ICD-10-CM

## 2023-01-11 NOTE — Progress Notes (Signed)
Medical Nutrition Therapy  Appointment Start time:  [phone]  Appointment End time:  1650  Primary concerns today: Diabetes, Weight Loss  Referral diagnosis: E66.9 - Obesity Preferred learning style: No preference indicated Learning readiness: Change in progress   NUTRITION ASSESSMENT   Anthropometrics  Ht: 5'8" Wt: 331.1 lbs BMI: 50.34 kg/m2 Goal Weight: 200 lbs.    Clinical Medical Hx: T2DM, HTN, HLD, OSA, BL Knee Arhtritis Medications: Atorvastatin, Labs (12/20/2022): A1c - 6.7%, FBG - 146, HDL - 39 (low), TGL - 230 (High), LDL - 107 (High) Notable Signs/Symptoms: Central Adiposity   Lifestyle & Dietary Hx Pt wife Marchelle Folks present during appointment. Pt reports being an "All or Nothing" type of person, states that they are after weight loss, Pt reports getting the diagnosis of DM on 12/20/2022, started on Ozempic and has lost ~18 lbs in the last 2 weeks. Pt reports joining [employer], goes with wife almost daily. Pt reports doing cardio (treadmill, handbike, recumbent bike) as well as resistance training. Pt reports spending 60 - 90 minutes at gym. Pt reports intermittent fasting at this time, states history of IF about a year ago, lost ~30 lbs but gained it back +10 more lbs. Pt reports previously eating "poor" diet, states they are aware of the effect of previous food choices. Pt reports being off work [employer]) for the summer, can go to the gym whenever currently but will be going back to work. Pt reports concern over continuing current changes when going back to work.    Estimated daily fluid intake: 64 oz Supplements: N/A Sleep: Sleeps well with CPAP Stress / self-care: Good overall, moderate stress at work occasionally Current average weekly physical activity: Gym, ~60 minutes   24-Hr Dietary Recall First Meal: none Snack: none Second Meal: [employer], "[address]" Steak, lettuce, corn, black beans, a few chips and queso, diet pepsi Snack: none Third  Meal: 10  Boneless wings (4 hot, 4 BBQ), Ceasar salad, diet Dr. Reino Kent Snack: none Beverages: Diet soda, water    NUTRITION DIAGNOSIS  NB-1.1 Food and nutrition-related knowledge deficit As related to diabetes.  As evidenced by A1c of 6.7%, dietary history high in energy dense foods, intermittent fasting.   NUTRITION INTERVENTION  Nutrition education (E-1) on the following topics:  Educated patient on the pathophysiology of diabetes. This includes why our bodies need circulating blood sugar, the relationship between insulin and blood sugar, and the results of insulin resistance and/or pancreatic insufficiency on the development of diabetes. Educated patient on factors that contribute to elevation of blood sugars, such as stress, illness, injury,and food choices. Discussed the role that physical activity plays in lowering blood sugar. Educate patient on the three main macronutrients. Protein, fats, and carbohydrates. Discussed how each of these macronutrients affect blood sugar levels, especially carbohydrate, and the importance of eating a consistent amount of carbohydrate throughout the day. Educated patient on carbohydrate counting, 15g of carbohydrate equals one carb choice. Advised patient on the importance of consistently checking their blood sugar, and recognizing how lifestyle and food choices affect those numbers.   Handouts Provided Include  Balanced Plate Food List  Learning Style & Readiness for Change Teaching method utilized: Visual & Auditory  Demonstrated degree of understanding via: Teach Back  Barriers to learning/adherence to lifestyle change: None  Goals Established by Pt When returning to work, plan on going to the gym at 7:00 PM in the evening on Monday and Wednesday for 1 hour. Work towards eating three meals a day, about  5-6 hours apart! Begin to recognize carbohydrates, proteins, and non-starchy vegetables in your food choices! Begin to build your meals using the  proportions of the Balanced Plate. First, select your carb choice(s) for the meal. Make this 25% of your meal. Next, select your source of protein to pair with your carb choice(s). Make this another 25% of your meal. Finally, complete your meal with a variety of non-starchy vegetables. Make this the remaining 50% of your meal.   MONITORING & EVALUATION Dietary intake, weekly physical activity, and weight loss in 2 months.  Next Steps  Patient is to follow up with RD.

## 2023-01-11 NOTE — Patient Instructions (Signed)
When returning to work, plan on going to the gym at 7:00 PM in the evening on Monday and Wednesday for 1 hour.  Work towards eating three meals a day, about 5-6 hours apart!  Begin to recognize carbohydrates, proteins, and non-starchy vegetables in your food choices!  Begin to build your meals using the proportions of the Balanced Plate. First, select your carb choice(s) for the meal. Make this 25% of your meal. Next, select your source of protein to pair with your carb choice(s). Make this another 25% of your meal. Finally, complete your meal with a variety of non-starchy vegetables. Make this the remaining 50% of your meal.

## 2023-01-12 ENCOUNTER — Encounter: Payer: Self-pay | Admitting: Dietician

## 2023-01-18 ENCOUNTER — Ambulatory Visit: Payer: BC Managed Care – PPO | Admitting: Dietician

## 2023-04-10 ENCOUNTER — Ambulatory Visit: Payer: BC Managed Care – PPO | Admitting: Dietician

## 2023-04-11 ENCOUNTER — Encounter: Payer: Self-pay | Admitting: Dietician

## 2023-04-11 ENCOUNTER — Encounter: Payer: BC Managed Care – PPO | Attending: Nurse Practitioner | Admitting: Dietician

## 2023-04-11 VITALS — Wt 294.2 lb

## 2023-04-11 DIAGNOSIS — E119 Type 2 diabetes mellitus without complications: Secondary | ICD-10-CM | POA: Insufficient documentation

## 2023-04-11 DIAGNOSIS — E66813 Obesity, class 3: Secondary | ICD-10-CM | POA: Insufficient documentation

## 2023-04-11 DIAGNOSIS — Z6841 Body Mass Index (BMI) 40.0 and over, adult: Secondary | ICD-10-CM | POA: Diagnosis not present

## 2023-04-11 NOTE — Progress Notes (Signed)
Medical Nutrition Therapy: Visit start time: 1605  end time: 1645  Assessment:  Diagnosis: obesity Medical history changes: improved BG control and cholesterol Psychosocial issues/ stress concerns: none  Medications, supplement changes: no changes per patient   Current weight: 294.2lbs Height: 5'8" BMI: 44.73  Progress and evaluation:  Weight loss of  37lbs since previous visit on 01/11/23; 11% of body weight. Patient reports total weight loss of about 60lbs since starting ozempic.  Checks weight daily, does get frustrated at times when weight increases or does not decrease from one day to the next. Fasting labs done 03/29/23-- HbA1C 5.6%,  total chol 130, LDL 64, HDL 40, Triglycerides 182 Patient has been including breakfast regularly He has been engaging in gym workouts less often since start of school year.  Does not like many vegetables; does like seasoned green beans, raw carrots and raw broccoli, and does eat salads.  Reports some constipation recently.      Dietary Intake:  Usual eating pattern includes 3 meals and 0-1 snacks per day. Dining out frequency: ? meals per week.  Breakfast: premier protein shake + apple daily Snack: none Lunch: was eating Atkins frozen meals; recently pouch tuna flavored + triple zero yogurt Snack: none Supper: trying limit carbs; 1 slice pizza + toppings of other slices (but feels 2 slices of topping and crust lead to better weight loss); taco salad ate 1/2 tortilla bowl; cauliflower rice; frozen cauli crust pizza Snack: none; occ popcorn or sugar free pudding; low sugar klondike ice cream bar; sugar free cookies Beverages: water 20oz daily, zero sugar soda 40-60oz  Physical activity: gym workout 60+ minutes 3-4 times a week (2 times in past 2 weeks)  Intervention:   Nutrition Care Education:  Basic nutrition: reviewed appropriate nutrient balance; appropriate meal and snack schedule; general nutrition guidelines    Weight control: reviewed  progress since previous visit; healthy rate of weight loss and normal weight fluctuations; focus on foods that are not calorie dense (like pizza toppings) vs only limiting carbs; options for including exercise during work week; discussed weight loss meds/ surgery as tools to help/ enhance lifestyle changes   Other Intervention Notes: Patient continues to lose weight steadily. He is motivated to continue with lifestyle changes Updated nutrition goals to ensure adequate nutrition and ongoing, appropriate weight loss.    Nutritional Diagnosis:  [place]-2.1 Inpaired nutrition utilization As related to diabetes.  As evidenced by BG and HbA1C controlled with medication, improving with weight loss. -3.3 Overweight/obesity As related to history of excess calories and inadequate physical activity.  As evidenced by patient with current BMI of 44.73, following calore and carbohydrate restricted eating pattern to promote ongoing weight loss.   Education Materials given:  Visit summary with goals/ instructions   Learner/ who was taught:  Patient  Spouse Izaah Westman  Level of understanding: Verbalizes/ demonstrates competency  Demonstrated degree of understanding via:   Teach back Learning barriers: None   Willingness to learn/ readiness for change: Eager, change in progress  Monitoring and Evaluation:  Dietary intake, exercise, and body weight      follow up:  07/18/23 at 4:00pm

## 2023-04-11 NOTE — Patient Instructions (Signed)
Continue with current eating pattern by managing portions of carbs, emphasizing lean protein foods and some low carb veggies.  Include a fruit or vegetable several times each day to increase natural fiber. Corn, popcorn, nuts, seeds (1/4 cup or less), and whole grains/ bran also provide fiber.  Increase daily fluids by finding ways to drinks more water or lightly flavored water (dilute flavored beverages with water) Keep up some regular exercise -- consider light exercise 1-2 days when not going to the gym.

## 2023-07-18 ENCOUNTER — Ambulatory Visit: Payer: BC Managed Care – PPO | Admitting: Dietician

## 2023-09-20 ENCOUNTER — Encounter: Payer: Self-pay | Admitting: Dietician

## 2023-09-20 NOTE — Progress Notes (Signed)
 Patient cancelled his appt for 07/18/23 and has not rescheduled. Sent notification to referring provider.
